# Patient Record
Sex: Female | Born: 1985 | Race: Black or African American | Hispanic: No | Marital: Single | Smoking: Never smoker
Health system: Southern US, Community
[De-identification: ages and names within clinical notes are randomized; demographics above are authoritative.]

## PROBLEM LIST (undated history)

## (undated) HISTORY — PX: TONSILLECTOMY: SUR1361

---

## 2009-06-19 ENCOUNTER — Emergency Department (HOSPITAL_BASED_OUTPATIENT_CLINIC_OR_DEPARTMENT_OTHER): Admission: EM | Admit: 2009-06-19 | Discharge: 2009-06-19 | Payer: Self-pay | Admitting: Emergency Medicine

## 2009-06-19 ENCOUNTER — Ambulatory Visit: Payer: Self-pay | Admitting: Radiology
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
## 2013-02-13 ENCOUNTER — Emergency Department (HOSPITAL_BASED_OUTPATIENT_CLINIC_OR_DEPARTMENT_OTHER)
Admission: EM | Admit: 2013-02-13 | Discharge: 2013-02-13 | Disposition: A | Discharge status: Home / Self Care | Payer: Medicaid Other | Attending: Emergency Medicine | Admitting: Emergency Medicine

## 2013-02-13 ENCOUNTER — Encounter (HOSPITAL_BASED_OUTPATIENT_CLINIC_OR_DEPARTMENT_OTHER): Payer: Self-pay | Admitting: Emergency Medicine

## 2013-02-13 DIAGNOSIS — Y9289 Other specified places as the place of occurrence of the external cause: Secondary | ICD-10-CM | POA: Insufficient documentation

## 2013-02-13 DIAGNOSIS — Y9389 Activity, other specified: Secondary | ICD-10-CM | POA: Insufficient documentation

## 2013-02-13 DIAGNOSIS — S39012A Strain of muscle, fascia and tendon of lower back, initial encounter: Secondary | ICD-10-CM

## 2013-02-13 DIAGNOSIS — S335XXA Sprain of ligaments of lumbar spine, initial encounter: Secondary | ICD-10-CM | POA: Insufficient documentation

## 2013-02-13 DIAGNOSIS — X500XXA Overexertion from strenuous movement or load, initial encounter: Secondary | ICD-10-CM | POA: Insufficient documentation

## 2013-02-13 DIAGNOSIS — Y990 Civilian activity done for income or pay: Secondary | ICD-10-CM | POA: Insufficient documentation

## 2013-02-13 MED ORDER — OXYCODONE-ACETAMINOPHEN 5-325 MG PO TABS: 1.0000 | ORAL_TABLET | Freq: Four times a day (QID) | ORAL | Status: AC | PRN

## 2013-02-13 NOTE — ED Notes (Signed)
Pt sts last Thursday had a very sharp back pain that brought her to her knees. Improved some, then Wednesday night she was helping move stuff at work and started having pain again, has been taking Aleve with minimal relief. Hurts to walk or do anything at all.

## 2013-02-13 NOTE — ED Provider Notes (Signed)
CSN: [id number]     Arrival date & time 02/13/13  1355 History   First MD Initiated Contact with Patient 02/13/13 1529     Chief Complaint  Patient presents with  . Back Pain   (Consider location/radiation/quality/duration/timing/severity/associated sxs/prior Treatment) Patient is a 27 y.o. female presenting with back pain. The history is provided by the patient.  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 Associated symptoms: no abdominal pain, no chest pain, no dysuria, no fever and no headaches     History reviewed. No pertinent past medical history. Past Surgical History  Procedure Laterality Date  . Cesarean section    . Tonsillectomy     History reviewed. No pertinent family history. History  Substance Use Topics  . Smoking status: Never Smoker   . Smokeless tobacco: Not on file  . Alcohol Use: Yes     Comment: occassional   OB History   Grav Para Term Preterm Abortions TAB SAB Ect Mult Living                 Review of Systems  Constitutional: Negative for fever and fatigue.  HENT: Negative for congestion and drooling.   Eyes: Negative for pain.  Respiratory: Negative for cough and shortness of breath.   Cardiovascular: Negative for chest pain.  Gastrointestinal: Negative for nausea, vomiting, abdominal pain and diarrhea.  Genitourinary: Negative for dysuria and hematuria.  Musculoskeletal: Positive for back pain. Negative for gait problem and neck pain.  Skin: Negative for color change.  Neurological: Negative for dizziness and headaches.  Hematological: Negative for adenopathy.  Psychiatric/Behavioral: Negative for behavioral problems.   All other systems reviewed and are negative.    Allergies  Review of patient's allergies indicates no known allergies.  Home Medications  No current outpatient prescriptions on file. BP 141/85  Pulse 77  Temp(Src) 98.1 F (36.7 C) (Oral)  Wt 180 lb (81.647 kg)  SpO2 100% Physical Exam  Nursing note and vitals reviewed. Constitutional: She is oriented to person, place, and time. She appears well-developed and well-nourished.  HENT:  Head: Normocephalic.  Mouth/Throat: Oropharynx is clear and moist. No oropharyngeal exudate.  Eyes: Conjunctivae and EOM are normal. Pupils are equal, round, and reactive to light.  Neck: Normal range of motion. Neck supple.  Cardiovascular: Normal rate, regular rhythm, normal heart sounds and intact distal pulses.  Exam reveals no gallop and no friction rub.   No murmur heard. Pulmonary/Chest: Effort normal and breath sounds normal. No respiratory distress. She has no wheezes.  Abdominal: Soft. Bowel sounds are normal. There is no tenderness. There is no rebound and no guarding.  Musculoskeletal: Normal range of motion. She exhibits no edema and no tenderness.  Mild tenderness to palpation of lower lumbar spine and right lumbar paraspinal area.  The patient notes worsening pain with rotation of torso as well as flexion and extension of torso.  Neurological: She is alert and oriented to person, place, and time.  Skin: Skin is warm and dry.  Psychiatric: She has a normal mood and affect. Her behavior is normal.    ED Course  Procedures (including critical care time) Labs Review Labs Reviewed - No data to  display Imaging Review No results found.  EKG Interpretation   None       MDM   1. Lumbar strain, initial encounter    3:35 PM 27 y.o. 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 She notes lower back pain worsened several days ago after performing more moving at her  work. She is afebrile and vital signs are unremarkable here. Her exam is consistent with a lower back strain. Will recommend light duty and will provide prescription for stronger pain medicine. On implanon, LMP 1 year ago.   3:37 PM:  I have discussed the diagnosis/risks/treatment options with the patient and believe the pt to be eligible for discharge home to follow-up with pcp as needed. We also discussed returning to the ED immediately if new or worsening sx occur. We discussed the sx which are most concerning (e.g., worsening pain) that necessitate immediate return. Any new prescriptions provided to the patient are listed below.  New Prescriptions   OXYCODONE-ACETAMINOPHEN (PERCOCET) 5-325 MG PER TABLET    Take 1 tablet by mouth every 6 (six) hours as needed for pain.       Junius Argyle, MD 02/13/13 [phone]

## 2013-04-14 ENCOUNTER — Emergency Department (HOSPITAL_BASED_OUTPATIENT_CLINIC_OR_DEPARTMENT_OTHER)
Admission: EM | Admit: 2013-04-14 | Discharge: 2013-04-14 | Disposition: A | Discharge status: Home / Self Care | Payer: Medicaid Other | Attending: Emergency Medicine | Admitting: Emergency Medicine

## 2013-04-14 ENCOUNTER — Encounter (HOSPITAL_BASED_OUTPATIENT_CLINIC_OR_DEPARTMENT_OTHER): Payer: Self-pay | Admitting: Emergency Medicine

## 2013-04-14 DIAGNOSIS — J111 Influenza due to unidentified influenza virus with other respiratory manifestations: Secondary | ICD-10-CM | POA: Insufficient documentation

## 2013-04-14 NOTE — ED Notes (Signed)
Pt c/o URi symptoms x 4 days 

## 2013-04-14 NOTE — ED Provider Notes (Signed)
CSN: [id number]     Arrival date & time 04/14/13  1523 History   First MD Initiated Contact with Patient 04/14/13 1616     Chief Complaint  Patient presents with  . URI   (Consider location/radiation/quality/duration/timing/severity/associated sxs/prior Treatment) HPI 27 y.o. Female complaining of fever, headache, cough, sore throat, and chills for four days.  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  Cough nonproductive and not dyspneic.  She has taken mucinex, thera flu, and alka seltzer.  No nauea,vomiting or diarrhea.  She did not have a flu shot this year and denies chronic health problems.   History reviewed. No pertinent past medical history. Past Surgical History  Procedure Laterality Date  . Cesarean section    . Tonsillectomy     History reviewed. No pertinent family history. History  Substance Use Topics  . Smoking status: Never Smoker   . Smokeless tobacco: Not on file  . Alcohol Use: Yes     Comment: occassional   OB History   Grav Para Term Preterm Abortions TAB SAB Ect Mult Living                 Review of Systems  All other systems reviewed and are negative.    Allergies  Review of patient's allergies indicates no known allergies.  Home Medications   Current Outpatient Rx  Name  Route  Sig  Dispense  Refill  . oxyCODONE-acetaminophen (PERCOCET) 5-325 MG per tablet   Oral   Take 1 tablet by mouth every 6 (six) hours as needed for pain.   10 tablet   0    BP 141/94  Pulse 100  Temp(Src) 97.8 F (36.6 C) (Oral)  Resp 18  Ht 5\' 1"  (1.549 m)  Wt 188 lb (85.276 kg)  BMI 35.54 kg/m2  SpO2 100% Physical Exam  Nursing note and vitals reviewed. Constitutional: She is oriented to person, place, and time. She appears well-developed and well-nourished.  HENT:  Head: Normocephalic and atraumatic.  Right Ear: External ear normal.  Left Ear: External ear normal.  Nose: Nose normal.  Mouth/Throat: Oropharynx is clear and moist.  Eyes:  Conjunctivae and EOM are normal. Pupils are equal, round, and reactive to light.  Neck: Normal range of motion. Neck supple.  Cardiovascular: Normal rate, regular rhythm, normal heart sounds and intact distal pulses.   Pulmonary/Chest: Effort normal and breath sounds normal.  Abdominal: Soft. Bowel sounds are normal.  Musculoskeletal: Normal range of motion.  Neurological: She is alert and oriented to person, place, and time. She has normal reflexes.  Skin: Skin is warm and dry.  Psychiatric: She has a normal mood and affect. Her behavior is normal. Thought content normal.    ED Course  Procedures (including critical care time) Labs Review Labs Reviewed - No data to display Imaging Review No results found.  EKG Interpretation   None       MDM  No diagnosis found. Influenza like illness with no other illnesses and does not appear to have any complications.  She is advised and given work note.      Hilario Quarry, MD 04/14/13 [phone]

## 2014-12-28 ENCOUNTER — Emergency Department (HOSPITAL_BASED_OUTPATIENT_CLINIC_OR_DEPARTMENT_OTHER)
Admission: EM | Admit: 2014-12-28 | Discharge: 2014-12-28 | Disposition: A | Discharge status: Home / Self Care | Payer: Medicaid Other | Attending: Emergency Medicine | Admitting: Emergency Medicine

## 2014-12-28 ENCOUNTER — Encounter (HOSPITAL_BASED_OUTPATIENT_CLINIC_OR_DEPARTMENT_OTHER): Payer: Self-pay

## 2014-12-28 ENCOUNTER — Emergency Department (HOSPITAL_BASED_OUTPATIENT_CLINIC_OR_DEPARTMENT_OTHER): Payer: Medicaid Other

## 2014-12-28 DIAGNOSIS — E876 Hypokalemia: Secondary | ICD-10-CM

## 2014-12-28 DIAGNOSIS — Z3202 Encounter for pregnancy test, result negative: Secondary | ICD-10-CM | POA: Insufficient documentation

## 2014-12-28 DIAGNOSIS — E860 Dehydration: Secondary | ICD-10-CM

## 2014-12-28 DIAGNOSIS — R002 Palpitations: Secondary | ICD-10-CM

## 2014-12-28 DIAGNOSIS — R0602 Shortness of breath: Secondary | ICD-10-CM | POA: Insufficient documentation

## 2014-12-28 LAB — BASIC METABOLIC PANEL
Anion gap: 11 (ref 5–15)
BUN: 10 mg/dL (ref 6–20)
CALCIUM: 9.4 mg/dL (ref 8.9–10.3)
CO2: 23 mmol/L (ref 22–32)
CREATININE: 0.8 mg/dL (ref 0.44–1.00)
Chloride: 107 mmol/L (ref 101–111)
Glucose, Bld: 91 mg/dL (ref 65–99)
Potassium: 3.1 mmol/L — ABNORMAL LOW (ref 3.5–5.1)
SODIUM: 141 mmol/L (ref 135–145)

## 2014-12-28 LAB — CBC WITH DIFFERENTIAL/PLATELET
BASOS ABS: 0 10*3/uL (ref 0.0–0.1)
BASOS PCT: 0 %
EOS ABS: 0 10*3/uL (ref 0.0–0.7)
EOS PCT: 0 %
HEMATOCRIT: 35 % — AB (ref 36.0–46.0)
Hemoglobin: 11.2 g/dL — ABNORMAL LOW (ref 12.0–15.0)
Lymphocytes Relative: 33 %
Lymphs Abs: 3.3 10*3/uL (ref 0.7–4.0)
MCH: 24.3 pg — ABNORMAL LOW (ref 26.0–34.0)
MCHC: 32 g/dL (ref 30.0–36.0)
MCV: 75.9 fL — ABNORMAL LOW (ref 78.0–100.0)
MONO ABS: 0.9 10*3/uL (ref 0.1–1.0)
MONOS PCT: 9 %
Neutro Abs: 5.8 10*3/uL (ref 1.7–7.7)
Neutrophils Relative %: 57 %
PLATELETS: 328 10*3/uL (ref 150–400)
RBC: 4.61 MIL/uL (ref 3.87–5.11)
RDW: 13.7 % (ref 11.5–15.5)
WBC: 10 10*3/uL (ref 4.0–10.5)

## 2014-12-28 LAB — RAPID URINE DRUG SCREEN, HOSP PERFORMED
AMPHETAMINES: NOT DETECTED
Barbiturates: NOT DETECTED
Benzodiazepines: NOT DETECTED
COCAINE: NOT DETECTED
OPIATES: NOT DETECTED
TETRAHYDROCANNABINOL: NOT DETECTED

## 2014-12-28 LAB — URINE MICROSCOPIC-ADD ON

## 2014-12-28 LAB — URINALYSIS, ROUTINE W REFLEX MICROSCOPIC
BILIRUBIN URINE: NEGATIVE
GLUCOSE, UA: NEGATIVE mg/dL
Ketones, ur: 40 mg/dL — AB
Leukocytes, UA: NEGATIVE
NITRITE: NEGATIVE
PH: 5.5 (ref 5.0–8.0)
Protein, ur: NEGATIVE mg/dL
SPECIFIC GRAVITY, URINE: 1.035 — AB (ref 1.005–1.030)
Urobilinogen, UA: 1 mg/dL (ref 0.0–1.0)

## 2014-12-28 LAB — TSH: TSH: 0.971 u[IU]/mL (ref 0.350–4.500)

## 2014-12-28 LAB — PREGNANCY, URINE: Preg Test, Ur: NEGATIVE

## 2014-12-28 LAB — D-DIMER, QUANTITATIVE (NOT AT ARMC): D DIMER QUANT: 0.35 ug{FEU}/mL (ref 0.00–0.48)

## 2014-12-28 LAB — TROPONIN I: Troponin I: <0.03 ng/mL (ref <0.031)

## 2014-12-28 MED ORDER — POTASSIUM CHLORIDE CRYS ER 20 MEQ PO TBCR
40.0000 meq | EXTENDED_RELEASE_TABLET | Freq: Once | ORAL | Status: AC
  Administered 2014-12-28: 40 meq via ORAL
  Filled 2014-12-28: qty 2

## 2014-12-28 NOTE — ED Notes (Signed)
Reports heart palpitations.  Described as though she feels like she has been running all day.  Reports nausea and SOB.

## 2014-12-28 NOTE — Discharge Instructions (Signed)
Hypokalemia Hypokalemia means that the amount of potassium in the blood is lower than normal.Potassium is a chemical, called an electrolyte, that helps regulate the amount of fluid in the body. It also stimulates muscle contraction and helps nerves function properly.Most of the body's potassium is inside of cells, and only a very small amount is in the blood. Because the amount in the blood is so small, minor changes can be life-threatening. CAUSES  Antibiotics.  Diarrhea or vomiting.  Using laxatives too much, which can cause diarrhea.  Chronic kidney disease.  Water pills (diuretics).  Eating disorders (bulimia).  Low magnesium level.  Sweating a lot. SIGNS AND SYMPTOMS  Weakness.  Constipation.  Fatigue.  Muscle cramps.  Mental confusion.  Skipped heartbeats or irregular heartbeat (palpitations).  Tingling or numbness. DIAGNOSIS  Your health care provider can diagnose hypokalemia with blood tests. In addition to checking your potassium level, your health care provider may also check other lab tests. TREATMENT Hypokalemia can be treated with potassium supplements taken by mouth or adjustments in your current medicines. If your potassium level is very low, you may need to get potassium through a vein (IV) and be monitored in the hospital. A diet high in potassium is also helpful. Foods high in potassium are:  Nuts, such as peanuts and pistachios.  Seeds, such as sunflower seeds and pumpkin seeds.  Peas, lentils, and lima beans.  Whole grain and bran cereals and breads.  Fresh fruit and vegetables, such as apricots, avocado, bananas, cantaloupe, kiwi, oranges, tomatoes, asparagus, and potatoes.  Orange and tomato juices.  Red meats.  Fruit yogurt. HOME CARE INSTRUCTIONS  Take all medicines as prescribed by your health care provider.  Maintain a healthy diet by including nutritious food, such as fruits, vegetables, nuts, whole grains, and lean meats.  If  you are taking a laxative, be sure to follow the directions on the label. SEEK MEDICAL CARE IF:  Your weakness gets worse.  You feel your heart pounding or racing.  You are vomiting or having diarrhea.  You are diabetic and having trouble keeping your blood glucose in the normal range. SEEK IMMEDIATE MEDICAL CARE IF:  You have chest pain, shortness of breath, or dizziness.  You are vomiting or having diarrhea for more than 2 days.  You faint. MAKE SURE YOU:   Understand these instructions.  Will watch your condition.  Will get help right away if you are not doing well or get worse. Document Released: 04/01/2005 Document Revised: 01/20/2013 Document Reviewed: 10/02/2012 [facility] Patient Information 2015 [place], [place]. This information is not intended to replace advice given to you by your health care provider. Make sure you discuss any questions you have with your health care provider. Dehydration, Adult Dehydration is when you lose more fluids from the body than you take in. Vital organs like the kidneys, brain, and heart cannot function without a proper amount of fluids and salt. Any loss of fluids from the body can cause dehydration.  CAUSES   Vomiting.  Diarrhea.  Excessive sweating.  Excessive urine output.  Fever. SYMPTOMS  Mild dehydration  Thirst.  Dry lips.  Slightly dry mouth. Moderate dehydration  Very dry mouth.  Sunken eyes.  Skin does not bounce back quickly when lightly pinched and released.  Dark urine and decreased urine production.  Decreased tear production.  Headache. Severe dehydration  Very dry mouth.  Extreme thirst.  Rapid, weak pulse (more than 100 beats per minute at rest).  Cold hands and feet.  Not able to sweat in spite of heat and temperature.  Rapid breathing.  Blue lips.  Confusion and lethargy.  Difficulty being awakened.  Minimal urine production.  No tears. DIAGNOSIS  Your caregiver will diagnose  dehydration based on your symptoms and your exam. Blood and urine tests will help confirm the diagnosis. The diagnostic evaluation should also identify the cause of dehydration. TREATMENT  Treatment of mild or moderate dehydration can often be done at home by increasing the amount of fluids that you drink. It is best to drink small amounts of fluid more often. Drinking too much at one time can make vomiting worse. Refer to the home care instructions below. Severe dehydration needs to be treated at the hospital where you will probably be given intravenous (IV) fluids that contain water and electrolytes. HOME CARE INSTRUCTIONS   Ask your caregiver about specific rehydration instructions.  Drink enough fluids to keep your urine clear or pale yellow.  Drink small amounts frequently if you have nausea and vomiting.  Eat as you normally do.  Avoid:  Foods or drinks high in sugar.  Carbonated drinks.  Juice.  Extremely hot or cold fluids.  Drinks with caffeine.  Fatty, greasy foods.  Alcohol.  Tobacco.  Overeating.  Gelatin desserts.  Wash your hands well to avoid spreading bacteria and viruses.  Only take over-the-counter or prescription medicines for pain, discomfort, or fever as directed by your caregiver.  Ask your caregiver if you should continue all prescribed and over-the-counter medicines.  Keep all follow-up appointments with your caregiver. SEEK MEDICAL CARE IF:  You have abdominal pain and it increases or stays in one area (localizes).  You have a rash, stiff neck, or severe headache.  You are irritable, sleepy, or difficult to awaken.  You are weak, dizzy, or extremely thirsty. SEEK IMMEDIATE MEDICAL CARE IF:   You are unable to keep fluids down or you get worse despite treatment.  You have frequent episodes of vomiting or diarrhea.  You have blood or green matter (bile) in your vomit.  You have blood in your stool or your stool looks black and  tarry.  You have not urinated in 6 to 8 hours, or you have only urinated a small amount of very dark urine.  You have a fever.  You faint. MAKE SURE YOU:   Understand these instructions.  Will watch your condition.  Will get help right away if you are not doing well or get worse. Document Released: 04/01/2005 Document Revised: 06/24/2011 Document Reviewed: 11/19/2010 [facility] Patient Information 2015 [place], [place]. This information is not intended to replace advice given to you by your health care provider. Make sure you discuss any questions you have with your health care provider. Palpitations A palpitation is the feeling that your heartbeat is irregular or is faster than normal. It may feel like your heart is fluttering or skipping a beat. Palpitations are usually not a serious problem. However, in some cases, you may need further medical evaluation. CAUSES  Palpitations can be caused by:  Smoking.  Caffeine or other stimulants, such as diet pills or energy drinks.  Alcohol.  Stress and anxiety.  Strenuous physical activity.  Fatigue.  Certain medicines.  Heart disease, especially if you have a history of irregular heart rhythms (arrhythmias), such as atrial fibrillation, atrial flutter, or supraventricular tachycardia.  An improperly working pacemaker or defibrillator. DIAGNOSIS  To find the cause of your palpitations, your health care provider will take your medical history and perform a  physical exam. Your health care provider may also have you take a test called an ambulatory electrocardiogram (ECG). An ECG records your heartbeat patterns over a 24-hour period. You may also have other tests, such as:  Transthoracic echocardiogram (TTE). During echocardiography, sound waves are used to evaluate how blood flows through your heart.  Transesophageal echocardiogram (TEE).  Cardiac monitoring. This allows your health care provider to monitor your heart rate and rhythm  in real time.  Holter monitor. This is a portable device that records your heartbeat and can help diagnose heart arrhythmias. It allows your health care provider to track your heart activity for several days, if needed.  Stress tests by exercise or by giving medicine that makes the heart beat faster. TREATMENT  Treatment of palpitations depends on the cause of your symptoms and can vary greatly. Most cases of palpitations do not require any treatment other than time, relaxation, and monitoring your symptoms. Other causes, such as atrial fibrillation, atrial flutter, or supraventricular tachycardia, usually require further treatment. HOME CARE INSTRUCTIONS   Avoid:  Caffeinated coffee, tea, soft drinks, diet pills, and energy drinks.  Chocolate.  Alcohol.  Stop smoking if you smoke.  Reduce your stress and anxiety. Things that can help you relax include:  A method of controlling things in your body, such as your heartbeats, with your mind (biofeedback).  Yoga.  Meditation.  Physical activity such as swimming, jogging, or walking.  Get plenty of rest and sleep. SEEK MEDICAL CARE IF:   You continue to have a fast or irregular heartbeat beyond 24 hours.  Your palpitations occur more often. SEEK IMMEDIATE MEDICAL CARE IF:  You have chest pain or shortness of breath.  You have a severe headache.  You feel dizzy or you faint. MAKE SURE YOU:  Understand these instructions.  Will watch your condition.  Will get help right away if you are not doing well or get worse. Document Released: 03/29/2000 Document Revised: 04/06/2013 Document Reviewed: 05/31/2011 [facility] Patient Information 2015 [place], [place]. This information is not intended to replace advice given to you by your health care provider. Make sure you discuss any questions you have with your health care provider.

## 2014-12-28 NOTE — ED Notes (Signed)
Pt transported to XR.  

## 2014-12-28 NOTE — ED Provider Notes (Signed)
CSN: [id number]     Arrival date & time 12/28/14  1754 History   This chart was scribed for Arby Barrette, MD by Arlan Organ, ED Scribe. This patient was seen in room MH05/MH05 and the patient's care was started 6:27 PM.   Chief Complaint  Patient presents with  . Palpitations    The history is provided by the patient. No language interpreter was used.    HPI Comments: Vanetta Rule is a 29 y.o. female without any pertinent past medical history who presents to the Emergency Department here for palpitations which initially came on last night after going to bed. She reports that she didn't feel well all night and kept her awake all night. Pt states she feels as though she has been running all day long. Associated nausea, lightheadedness, dizziness, and shortness of breath also reported. Last known well all day yesterday. Denies feeling ill after eating dinner last night. No previous history of same. Denies any recent fever, chills, abdominal pain, chest pain, dysuria, urinary frequency, urinary urgency. Denies any frequent caffeine intake. She is not an every day drinker. Last alcohol consumption last week. She is not a smoker. No family or personal history of cardiac disease. LNMP unknown as she states her menstrual period comes and goes- Implanon in place. 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 No known allergies to medications. She reports weight loss over the past several months that was intentional through increased exercise.  History reviewed. No pertinent past medical history. Past Surgical History  Procedure Laterality Date  . Cesarean section    . Tonsillectomy     No family history on file. Social History  Substance Use Topics  . Smoking status: Never Smoker   . Smokeless tobacco: None  . Alcohol Use: Yes     Comment: occassional   OB History    No data available     Review of Systems  A complete 10 system review of systems was obtained and all systems are negative except  as noted in the HPI and PMH.     Allergies  Review of patient's allergies indicates no known allergies.  Home Medications   Prior to Admission medications   Medication Sig Start Date End Date Taking? Authorizing Provider  etonogestrel (IMPLANON) 68 MG IMPL implant 1 each by Subdermal route once.   Yes Historical Provider, MD  oxyCODONE-acetaminophen (PERCOCET) 5-325 MG per tablet Take 1 tablet by mouth every 6 (six) hours as needed for pain. 02/13/13   Purvis Sheffield, MD   Triage Vitals: BP 147/91 mmHg  Pulse 93  Temp(Src) 98.6 F (37 C) (Oral)  Resp 18  Ht 5\' 4"  (1.626 m)  Wt 173 lb (78.472 kg)  BMI 29.68 kg/m2  SpO2 100%   Physical Exam  Constitutional: She is oriented to person, place, and time. She appears well-developed and well-nourished.  HENT:  Head: Normocephalic and atraumatic.  Eyes: EOM are normal. Pupils are equal, round, and reactive to light.  Neck: Neck supple.  Cardiovascular: Normal rate, regular rhythm, normal heart sounds and intact distal pulses.   Pulmonary/Chest: Effort normal and breath sounds normal.  Abdominal: Soft. Bowel sounds are normal. She exhibits no distension. There is no tenderness.  Musculoskeletal: Normal range of motion. She exhibits no edema.  Neurological: She is alert and oriented to person, place, and time. She has normal strength. Coordination normal. GCS eye subscore is 4. GCS verbal subscore is 5. GCS motor subscore is 6.  Skin: Skin is warm, dry and intact.  Psychiatric: She has a normal mood and affect.    ED Course  Procedures (including critical care time)  DIAGNOSTIC STUDIES: Oxygen Saturation is 100% on RA, Normal by my interpretation.    COORDINATION OF CARE: 6:30 PM- Will order CBC, BMP, Troponin I, CXR, EKG. Discussed treatment plan with pt at bedside and pt agreed to plan.     Labs Review Labs Reviewed  CBC WITH DIFFERENTIAL/PLATELET - Abnormal; Notable for the following:    Hemoglobin 11.2 (*)    HCT 35.0 (*)     MCV 75.9 (*)    MCH 24.3 (*)    All other components within normal limits  BASIC METABOLIC PANEL - Abnormal; Notable for the following:    Potassium 3.1 (*)    All other components within normal limits  URINALYSIS, ROUTINE W REFLEX MICROSCOPIC (NOT AT [facility]) - Abnormal; Notable for the following:    Specific Gravity, Urine 1.035 (*)    Hgb urine dipstick LARGE (*)    Ketones, ur 40 (*)    All other components within normal limits  TROPONIN I  D-DIMER, QUANTITATIVE (NOT AT [facility])  PREGNANCY, URINE  URINE RAPID DRUG SCREEN, HOSP PERFORMED  URINE MICROSCOPIC-ADD ON  TSH    Imaging Review Dg Chest 2 View  12/28/2014   CLINICAL DATA:  Short of breath, palpitations  EXAM: CHEST  2 VIEW  COMPARISON:  None.  FINDINGS: Normal mediastinum and cardiac silhouette. Normal pulmonary vasculature. No evidence of effusion, infiltrate, or pneumothorax. No acute bony abnormality.  IMPRESSION: No acute cardiopulmonary process.   Electronically Signed   By: Genevive Bi M.D.   On: 12/28/2014 18:50   I have personally reviewed and evaluated these images and lab results as part of my medical decision-making.   EKG Interpretation   Date/Time:  Wednesday December 28 2014 18:05:14 EDT Ventricular Rate:  97 PR Interval:  126 QRS Duration: 80 QT Interval:  370 QTC Calculation: 469 R Axis:   70 Text Interpretation:  Normal sinus rhythm no ischemic appearance Confirmed  by Donnald Garre, MD, Lebron Conners [id number]) on 12/28/2014 6:17:17 PM      MDM   Final diagnoses:  Hypokalemia  Palpitation  Dehydration   Patient reports symptoms started last night with sleeplessness and feeling like her heart was racing. She is otherwise been well recently and has not had incrementally developing symptoms. Patient does not appear to have risk factors for PE or cardiac ischemic disease. Family history is negative and other risk factors are negative for dysrhythmia or sudden death. She is mildly tachycardic. Patient's labs  indicate mild hypokalemia and urine ketones. I feel her symptoms are most likely due to mild dehydration and possibly decreased by mouth intake. At this point time recommendations are for increase fluid intake and dietary intake. She is instructed to follow-up with her family physician and instructions are provided for return if symptoms should worsen or change.  Arby Barrette, MD 12/28/14 2030

## 2014-12-28 NOTE — ED Notes (Signed)
MD at bedside.
